# Patient Record
Sex: Female | Born: 2000 | Race: Black or African American | Hispanic: No | Marital: Single | Smoking: Never smoker
Health system: Southern US, Community
[De-identification: ages and names within clinical notes are randomized; demographics above are authoritative.]

## PROBLEM LIST (undated history)

## (undated) HISTORY — PX: HERNIA REPAIR: SHX51

---
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
## 2018-01-09 ENCOUNTER — Telehealth: Payer: Self-pay | Admitting: *Deleted

## 2018-01-09 NOTE — Telephone Encounter (Signed)
Lo LoEstrin denied by insurance.

## 2018-01-10 NOTE — Telephone Encounter (Signed)
Left message that Lo Lo not covered I have card to try

## 2018-01-23 ENCOUNTER — Ambulatory Visit: Payer: BLUE CROSS/BLUE SHIELD | Admitting: Adult Health

## 2020-02-05 ENCOUNTER — Ambulatory Visit: Payer: Self-pay | Attending: Internal Medicine

## 2020-02-05 DIAGNOSIS — Z23 Encounter for immunization: Secondary | ICD-10-CM

## 2020-02-05 NOTE — Progress Notes (Signed)
   Covid-19 Vaccination Clinic  Name:  Tricia Roth    MRN: [id number] DOB: 11-05-2000  02/05/2020  Tricia Roth was observed post Covid-19 immunization for 15 minutes without incident. She was provided with Vaccine Information Sheet and instruction to access the V-Safe system.   Tricia Roth was instructed to call 911 with any severe reactions post vaccine: Marland Kitchen Difficulty breathing  . Swelling of face and throat  . A fast heartbeat  . A bad rash all over body  . Dizziness and weakness   Immunizations Administered    Name Date Dose VIS Date Route   Moderna COVID-19 Vaccine 02/05/2020 12:18 PM 0.5 mL 05/2019 Intramuscular   Manufacturer: Moderna   Lot: [id number]   NDC: 02725-366-44

## 2020-03-04 ENCOUNTER — Ambulatory Visit: Payer: Self-pay

## 2020-07-07 ENCOUNTER — Ambulatory Visit: Payer: Self-pay | Admitting: Adult Health

## 2023-01-09 DIAGNOSIS — Z01419 Encounter for gynecological examination (general) (routine) without abnormal findings: Secondary | ICD-10-CM | POA: Diagnosis not present

## 2023-01-09 DIAGNOSIS — Z3202 Encounter for pregnancy test, result negative: Secondary | ICD-10-CM | POA: Diagnosis not present

## 2023-01-09 DIAGNOSIS — R87610 Atypical squamous cells of undetermined significance on cytologic smear of cervix (ASC-US): Secondary | ICD-10-CM | POA: Diagnosis not present

## 2023-01-09 DIAGNOSIS — Z3041 Encounter for surveillance of contraceptive pills: Secondary | ICD-10-CM | POA: Diagnosis not present

## 2023-01-09 DIAGNOSIS — N920 Excessive and frequent menstruation with regular cycle: Secondary | ICD-10-CM | POA: Diagnosis not present

## 2023-01-09 DIAGNOSIS — Z113 Encounter for screening for infections with a predominantly sexual mode of transmission: Secondary | ICD-10-CM | POA: Diagnosis not present

## 2023-01-09 DIAGNOSIS — Z114 Encounter for screening for human immunodeficiency virus [HIV]: Secondary | ICD-10-CM | POA: Diagnosis not present

## 2023-01-09 LAB — CYTOLOGY - PAP

## 2023-01-30 ENCOUNTER — Encounter: Payer: Self-pay | Admitting: Emergency Medicine

## 2023-01-30 ENCOUNTER — Ambulatory Visit
Admission: EM | Admit: 2023-01-30 | Discharge: 2023-01-30 | Disposition: A | Discharge status: Home / Self Care | Payer: Medicaid Other

## 2023-01-30 DIAGNOSIS — K219 Gastro-esophageal reflux disease without esophagitis: Secondary | ICD-10-CM

## 2023-01-30 DIAGNOSIS — K5909 Other constipation: Secondary | ICD-10-CM

## 2023-01-30 MED ORDER — PANTOPRAZOLE SODIUM 40 MG PO TBEC: 40.0000 mg | DELAYED_RELEASE_TABLET | Freq: Every day | ORAL | 0 refills | Status: AC

## 2023-01-30 MED ORDER — LIDOCAINE VISCOUS HCL 2 % MT SOLN
15.0000 mL | Freq: Once | OROMUCOSAL | Status: AC
  Administered 2023-01-30: 15 mL via OROMUCOSAL

## 2023-01-30 MED ORDER — SENNOSIDES-DOCUSATE SODIUM 8.6-50 MG PO TABS: 1.0000 | ORAL_TABLET | Freq: Every day | ORAL | 0 refills | Status: AC

## 2023-01-30 MED ORDER — ALUM & MAG HYDROXIDE-SIMETH 200-200-20 MG/5ML PO SUSP
30.0000 mL | Freq: Once | ORAL | Status: AC
  Administered 2023-01-30: 30 mL via ORAL

## 2023-01-30 NOTE — ED Provider Notes (Signed)
[facility]    CSN: [id number] Arrival date & time: 01/30/23  0934      History   Chief Complaint No chief complaint on file.   HPI Tricia Roth is a 22 y.o. female.   The history is provided by the patient.   Patient presents with a 4-day history of acid reflux.  Patient also complains of constipation, states her last bowel movement was approximately 3 days ago.  Patient reports that she feels like there is something "sitting on her chest".  She also states that she has had more "foam" in her throat.  She does have a history of reflux disease, she normally takes Prilosec.  Prilosec is not helping at this time.  She knows that her triggers are drinking alcohol and red meat.  She reports that she did drink heavily on a vacation approximately 4 days ago.  With regard to her abdominal pain, she does complain of bloating.  She denies fever, chills, abdominal pain, nausea, vomiting, diarrhea, or bloody stools.  Patient reports that she also has a history of constipation.  Past Medical History:  Diagnosis Date   Premature baby     Patient Active Problem List   Diagnosis Date Noted   Irregular intermenstrual bleeding 12/25/2017   Pregnancy examination or test, negative result 12/25/2017   Pelvic pain 12/25/2017   Constipation 12/25/2017   Nausea 12/25/2017   Breast tenderness 12/25/2017    Past Surgical History:  Procedure Laterality Date   HERNIA REPAIR      OB History     Gravida  0   Para  0   Term  0   Preterm  0   AB  0   Living  0      SAB  0   IAB  0   Ectopic  0   Multiple  0   Live Births  0            Home Medications    Prior to Admission medications   Medication Sig Start Date End Date Taking? Authorizing Provider  levonorgestrel-ethinyl estradiol (SEASONALE) 0.15-0.03 MG tablet Take 1 tablet by mouth daily.   Yes [provider]  omeprazole (PRILOSEC) 20 MG capsule Take 20 mg by mouth daily.   Yes [provider]    Family History Family History  Problem Relation Age of Onset   Hypertension Paternal Grandfather    Diabetes Paternal Grandfather    Hypertension Paternal Grandmother    Diabetes Paternal Grandmother    Breast cancer Maternal Grandmother    Hypertension Maternal Grandfather    Psoriasis Father     Social History Social History   Tobacco Use   Smoking status: Never   Smokeless tobacco: Never  Vaping Use   Vaping status: Never Used  Substance Use Topics   Alcohol use: No   Drug use: No     Allergies   Patient has no known allergies.   Review of Systems Review of Systems Per HPI  Physical Exam Triage Vital Signs ED Triage Vitals  Encounter Vitals Group     BP 01/30/23 0943 (!) 120/49     Systolic BP Percentile --      Diastolic BP Percentile --      Pulse Rate 01/30/23 0943 64     Resp 01/30/23 0943 16     Temp 01/30/23 0943 98.3 F (36.8 C)     Temp Source 01/30/23 0943 Oral     SpO2 01/30/23  0943 98 %     Weight --      Height --      Head Circumference --      Peak Flow --      Pain Score 01/30/23 0945 7     Pain Loc --      Pain Education --      Exclude from Growth Chart --    No data found.  Updated Vital Signs BP (!) 120/49 (BP Location: Right Arm)   Pulse 64   Temp 98.3 F (36.8 C) (Oral)   Resp 16   LMP 12/30/2022 (Exact Date)   SpO2 98%   Visual Acuity Right Eye Distance:   Left Eye Distance:   Bilateral Distance:    Right Eye Near:   Left Eye Near:    Bilateral Near:     Physical Exam Vitals and nursing note reviewed.  Constitutional:      General: She is not in acute distress.    Appearance: Normal appearance.  HENT:     Head: Normocephalic.  Eyes:     Extraocular Movements: Extraocular movements intact.     Conjunctiva/sclera: Conjunctivae normal.     Pupils: Pupils are equal, round, and reactive to light.  Cardiovascular:     Rate and Rhythm: Normal rate and regular rhythm.     Pulses: Normal  pulses.     Heart sounds: Normal heart sounds.  Pulmonary:     Effort: Pulmonary effort is normal.     Breath sounds: Normal breath sounds.  Abdominal:     General: Bowel sounds are normal.     Palpations: Abdomen is soft.     Tenderness: There is no abdominal tenderness.     Comments: GI cocktail administered.  Patient reports good relief of symptoms after GI cocktail.  Musculoskeletal:     Cervical back: Normal range of motion.  Lymphadenopathy:     Cervical: No cervical adenopathy.  Skin:    General: Skin is warm and dry.  Neurological:     General: No focal deficit present.     Mental Status: She is alert and oriented to person, place, and time.  Psychiatric:        Mood and Affect: Mood normal.        Behavior: Behavior normal.      UC Treatments / Results  Labs (all labs ordered are listed, but only abnormal results are displayed) Labs Reviewed - No data to display  EKG   Radiology No results found.  Procedures Procedures (including critical care time)  Medications Ordered in UC Medications  alum & mag hydroxide-simeth (MAALOX/MYLANTA) 200-200-20 MG/5ML suspension 30 mL (30 mLs Oral Given 01/30/23 1018)  lidocaine (XYLOCAINE) 2 % viscous mouth solution 15 mL (15 mLs Mouth/Throat Given 01/30/23 1018)    Initial Impression / Assessment and Plan / UC Course  I have reviewed the triage vital signs and the nursing notes.  Pertinent labs & imaging results that were available during my care of the patient were reviewed by me and considered in my medical decision making (see chart for details).  The patient is well-appearing, she is in no acute distress, vital signs are stable.  Patient with an exacerbation of her reflux disease.  GI cocktail was administered, with good relief of symptoms.  Will start patient on Protonix 40 mg for reflux.  For her constipation, patient was prescribed Senokot 8.6-50 mg tablets to take daily.  Supportive care recommendations were  provided for patient's reflux and  constipation to include avoiding triggers for her reflux, taking medication daily, eating a healthy diet, and increasing her fluid intake, and staying active.  Patient advised to follow-up with her primary care physician if symptoms do not improve with this treatment.  Patient is in agreement with this plan of care and verbalizes understanding.  All questions were answered.  Patient stable for discharge.Work note was provided.   Final Clinical Impressions(s) / UC Diagnoses   Final diagnoses:  None   Discharge Instructions   None    ED Prescriptions   None    PDMP not reviewed this encounter.   Abran Cantor, NP 01/30/23 1043

## 2023-01-30 NOTE — Discharge Instructions (Addendum)
Take medication as prescribed. Increase fluids and allow for plenty of rest. Avoid triggers for your reflux to include alcohol, red meat, or other things such as caffeine, dairy, spicy, or tomato-based foods. Make sure you are chewing your food well before swallowing.  Also recommend eating at least 2 to 3 hours before bedtime. For your constipation, is important for you to drink plenty of fluids, preferably 8-10 8 ounce glasses of water daily.  Is also help for you to increase the fruits and vegetables in your diet and to remain active to prevent constipation. If symptoms are not improving with this treatment, please follow-up with your primary care physician for further evaluation. Follow-up as needed.

## 2023-01-30 NOTE — ED Triage Notes (Signed)
States flare up with acid reflux after drinking heavily on vacation  x 4 days.  Has been taking Prilosec without relief.

## 2023-02-01 ENCOUNTER — Emergency Department (HOSPITAL_COMMUNITY)
Admission: EM | Admit: 2023-02-01 | Discharge: 2023-02-01 | Disposition: A | Discharge status: Home / Self Care | Payer: Medicaid Other | Attending: Emergency Medicine | Admitting: Emergency Medicine

## 2023-02-01 ENCOUNTER — Emergency Department (HOSPITAL_COMMUNITY): Payer: Medicaid Other

## 2023-02-01 ENCOUNTER — Encounter (HOSPITAL_COMMUNITY): Payer: Self-pay

## 2023-02-01 DIAGNOSIS — K219 Gastro-esophageal reflux disease without esophagitis: Secondary | ICD-10-CM | POA: Diagnosis not present

## 2023-02-01 DIAGNOSIS — R1030 Lower abdominal pain, unspecified: Secondary | ICD-10-CM | POA: Diagnosis present

## 2023-02-01 DIAGNOSIS — Z20822 Contact with and (suspected) exposure to covid-19: Secondary | ICD-10-CM | POA: Insufficient documentation

## 2023-02-01 DIAGNOSIS — R0981 Nasal congestion: Secondary | ICD-10-CM | POA: Insufficient documentation

## 2023-02-01 DIAGNOSIS — R12 Heartburn: Secondary | ICD-10-CM

## 2023-02-01 DIAGNOSIS — R059 Cough, unspecified: Secondary | ICD-10-CM | POA: Diagnosis not present

## 2023-02-01 DIAGNOSIS — R0789 Other chest pain: Secondary | ICD-10-CM | POA: Diagnosis not present

## 2023-02-01 LAB — SARS CORONAVIRUS 2 BY RT PCR: SARS Coronavirus 2 by RT PCR: NEGATIVE

## 2023-02-01 MED ORDER — SUCRALFATE 1 G PO TABS: 1.0000 g | ORAL_TABLET | Freq: Three times a day (TID) | ORAL | 0 refills | Status: AC | PRN

## 2023-02-01 MED ORDER — POLYETHYLENE GLYCOL 3350 17 GM/SCOOP PO POWD: 1.0000 | Freq: Once | ORAL | 0 refills | Status: AC

## 2023-02-01 MED ORDER — ONDANSETRON 8 MG PO TBDP
8.0000 mg | ORAL_TABLET | Freq: Once | ORAL | Status: AC
  Administered 2023-02-01: 8 mg via ORAL
  Filled 2023-02-01: qty 1

## 2023-02-01 MED ORDER — SUCRALFATE 1 G PO TABS
1.0000 g | ORAL_TABLET | Freq: Once | ORAL | Status: AC
  Administered 2023-02-01: 1 g via ORAL
  Filled 2023-02-01: qty 1

## 2023-02-01 MED ORDER — ONDANSETRON 4 MG PO TBDP: 4.0000 mg | ORAL_TABLET | Freq: Three times a day (TID) | ORAL | 0 refills | Status: AC | PRN

## 2023-02-01 MED ORDER — FAMOTIDINE 20 MG PO TABS
20.0000 mg | ORAL_TABLET | Freq: Once | ORAL | Status: AC
  Administered 2023-02-01: 20 mg via ORAL
  Filled 2023-02-01: qty 1

## 2023-02-01 NOTE — ED Notes (Signed)
Pt states, "I drank a lot of liquor in [place] last week and I think that's what started all of this."

## 2023-02-01 NOTE — ED Provider Notes (Signed)
[place] EMERGENCY DEPARTMENT AT [facility] Provider Note   CSN: [id number] Arrival date & time: 02/01/23  4259     History  Chief Complaint  Patient presents with   Nausea    Tricia Roth is a 22 y.o. female.  HPI   22 year old female presents emergency department with complaints of "worsening acid reflux."  Patient states that she went to [place] last week and drink more alcohol than usual on Friday and since then has had worsening burning sensation in her upper abdomen as well as in her esophagus.  Patient reports history of GERD and was on medicine in the past for treatment but has since not been taking it.  Was seen at the urgent care yesterday and given GI cocktail with significant improvement of symptoms.  States she was sent home with Protonix but due to recurrence of symptoms today, prompted visit to the emergency department.  Reports worsening of burning type sensation after she eats or drinks liquid and relief when she is not consuming anything.  Also states she has had cough as well as some nasal congestion and mildly sore throat since traveling on Friday and is concerned about possible COVID exposure.  She states that along with cough, has had some lower abdominal pain that is only experienced when she is coughing and not experienced otherwise.  Denies any fever, chills, shortness of breath, vomiting, urinary symptoms, change in bowel habits.  End of her last menstrual period July 20 and patient states she is not concerned about pregnancy.  Past medical history significant for GERD  Home Medications Prior to Admission medications   Medication Sig Start Date End Date Taking? Authorizing Provider  ondansetron (ZOFRAN-ODT) 4 MG disintegrating tablet Take 1 tablet (4 mg total) by mouth every 8 (eight) hours as needed. 02/01/23  Yes Sherian Maroon A, PA  polyethylene glycol powder (GLYCOLAX/MIRALAX) 17 GM/SCOOP powder Take 255 g by mouth once for 1 dose. 02/01/23  02/01/23 Yes Sherian Maroon A, PA  sucralfate (CARAFATE) 1 g tablet Take 1 tablet (1 g total) by mouth 3 (three) times daily as needed (with meals). 02/01/23  Yes Peter Garter, PA  levonorgestrel-ethinyl estradiol (SEASONALE) 0.15-0.03 MG tablet Take 1 tablet by mouth daily.    [provider]  omeprazole (PRILOSEC) 20 MG capsule Take 20 mg by mouth daily.    [provider]  pantoprazole (PROTONIX) 40 MG tablet Take 1 tablet (40 mg total) by mouth daily. 01/30/23   Leath-Warren, Sadie Haber, NP  senna-docusate (SENOKOT-S) 8.6-50 MG tablet Take 1 tablet by mouth at bedtime. 01/30/23   Leath-Warren, Sadie Haber, NP      Allergies    Patient has no known allergies.    Review of Systems   Review of Systems  All other systems reviewed and are negative.   Physical Exam Updated Vital Signs BP 138/74 (BP Location: Right Arm)   Pulse 60   Temp 98.3 F (36.8 C) (Oral)   Resp 17   LMP 12/30/2022 (Exact Date)   SpO2 100%  Physical Exam Vitals and nursing note reviewed.  Constitutional:      General: She is not in acute distress.    Appearance: She is well-developed.  HENT:     Head: Normocephalic and atraumatic.     Nose: Congestion and rhinorrhea present.     Mouth/Throat:     Comments: Mild posterior pharyngeal erythema.  Uvula midline and symmetric with phonation.  No sublingual or semitubular swelling.  Tonsils  are 1+ bilaterally with no obvious exudate.  No trismus.  No changes in phonation per patient. Eyes:     Conjunctiva/sclera: Conjunctivae normal.  Cardiovascular:     Rate and Rhythm: Normal rate and regular rhythm.     Heart sounds: No murmur heard. Pulmonary:     Effort: Pulmonary effort is normal. No respiratory distress.     Breath sounds: Normal breath sounds. No wheezing, rhonchi or rales.  Chest:     Chest wall: No tenderness.  Abdominal:     Palpations: Abdomen is soft.     Tenderness: There is no abdominal tenderness. There is no right CVA  tenderness, left CVA tenderness or guarding.  Musculoskeletal:        General: No swelling.     Cervical back: Neck supple.     Right lower leg: No edema.     Left lower leg: No edema.  Skin:    General: Skin is warm and dry.     Capillary Refill: Capillary refill takes less than 2 seconds.  Neurological:     Mental Status: She is alert.  Psychiatric:        Mood and Affect: Mood normal.     ED Results / Procedures / Treatments   Labs (all labs ordered are listed, but only abnormal results are displayed) Labs Reviewed  SARS CORONAVIRUS 2 BY RT PCR    EKG EKG Interpretation Date/Time:  Thursday February 01 2023 09:52:36 EDT Ventricular Rate:  55 PR Interval:  138 QRS Duration:  65 QT Interval:  411 QTC Calculation: 394 R Axis:   38  Text Interpretation: Sinus rhythm no acute ST/T changes No old tracing to compare Confirmed by Pricilla Loveless [phone]) on 02/01/2023 9:58:52 AM  Radiology DG Chest 2 View  Result Date: 02/01/2023 CLINICAL DATA:  Cough.  History of acid reflux for 6 days EXAM: CHEST - 2 VIEW COMPARISON:  X-ray 08/16/2016 FINDINGS: The heart size and mediastinal contours are within normal limits. Both lungs are clear. No consolidation, pneumothorax or effusion. No edema. The visualized skeletal structures are unremarkable. IMPRESSION: No acute cardiopulmonary disease. Electronically Signed   By: Karen Kays M.D.   On: 02/01/2023 10:05    Procedures Procedures    Medications Ordered in ED Medications  sucralfate (CARAFATE) tablet 1 g (1 g Oral Given 02/01/23 0954)  famotidine (PEPCID) tablet 20 mg (20 mg Oral Given 02/01/23 0954)  ondansetron (ZOFRAN-ODT) disintegrating tablet 8 mg (8 mg Oral Given 02/01/23 6045)    ED Course/ Medical Decision Making/ A&P                                 Medical Decision Making  This patient presents to the ED for concern of burning type sensation in her chest as well as upper abdomen, this involves an extensive number of  treatment options, and is a complaint that carries with it a high risk of complications and morbidity.  The differential diagnosis includes ACS, PE, GERD, gastritis, esophagitis, COVID, RSV, influenza, pneumonia   Co morbidities that complicate the patient evaluation  See HPI   Additional history obtained:  Additional history obtained from EMR External records from outside source obtained and reviewed including hospital records   Lab Tests:  I Ordered, and personally interpreted labs.  The pertinent results include: COVID which is pending   Imaging Studies ordered:  I ordered imaging studies including chest x-ray I independently visualized and  interpreted imaging which showed no acute cardiopulmonary abnormalities I agree with the radiologist interpretation   Cardiac Monitoring: / EKG:  The patient was maintained on a cardiac monitor.  I personally viewed and interpreted the cardiac monitored which showed an underlying rhythm of: Sinus rhythm   Consultations Obtained:  N/a   Problem List / ED Course / Critical interventions / Medication management  GERD I ordered medication including Zofran, Pepcid, Carafate  Reevaluation of the patient after these medicines showed that the patient improved I have reviewed the patients home medicines and have made adjustments as needed   Social Determinants of Health:  Denies tobacco, illicit drug use   Test / Admission - Considered:  GERD Vitals signs within normal range and stable throughout visit. Laboratory/imaging studies significant for: See above 22 year old female presents emergency department with complaints of intermittent burning type sensation in her upper abdomen with radiation "up my esophagus."  Patient symptoms seem to be worsened with consumption of food with no other exacerbating factors.  Chest discomfort seems more likely associated to reflux.  Patient with chest x-ray without signs of any acute  cardiopulmonary abnormality.  Low suspicion for pneumonia, esophageal perforation, pneumomediastinum or other abnormality.  Patient's EKG without evidence of ischemic changes; low suspicion for ACS.  Patient did note improvement of symptoms with administration of GI cocktail in the emergency department.  Will recommend continued use of Protonix in the outpatient setting as well as carafate to use as needed.  Patient has only had 1 day of Protonix so do not think she has technically failed said therapy.  Will recommend additional lifestyle modifications as depicted in AVS.  Will recommend close follow-up with primary care for reevaluation of symptoms.  Treatment plan discussed at length with patient and she acknowledged understanding was agreeable to said plan.  Patient overall well-appearing, afebrile in no acute distress. Worrisome signs and symptoms were discussed with the patient, and the patient acknowledged understanding to return to the ED if noticed. Patient was stable upon discharge.          Final Clinical Impression(s) / ED Diagnoses Final diagnoses:  Waterbrash  Gastroesophageal reflux disease, unspecified whether esophagitis present    Rx / DC Orders ED Discharge Orders          Ordered    sucralfate (CARAFATE) 1 g tablet  3 times daily PRN        02/01/23 1034    ondansetron (ZOFRAN-ODT) 4 MG disintegrating tablet  Every 8 hours PRN        02/01/23 1034    polyethylene glycol powder (GLYCOLAX/MIRALAX) 17 GM/SCOOP powder   Once        02/01/23 1034              Peter Garter, [place] 02/01/23 1046    Pricilla Loveless, MD 02/02/23 1523

## 2023-02-01 NOTE — Discharge Instructions (Addendum)
As discussed, chest x-ray was without abnormality.  EKG normal.  I suspect your symptoms are likely secondary to GERD or reflux.  Recommend continue use of your Protonix in the outpatient setting as well as adding Carafate to use as needed.  See information attached your discharge papers regarding lifestyle modifications you may try to decrease likelihood of "GERD flareups."  Regarding feelings of constipation, you may add a daily fiber supplement that you can find over-the-counter local pharmacy as well as use  miralax 2-4 capfuls intially to help regularize your bowel movements. You may increase or decrease miralax for soft, regular bowel movments.  Follow MyChart for the results of your COVID testing.  Recommend follow-up with primary care for reassessment of your symptoms.  Please do not hesitate to return to emergency department if the worrisome signs and symptoms we discussed become apparent.

## 2023-02-01 NOTE — ED Triage Notes (Signed)
Pt arrives via POV with c/o nausea and reflux since returning from [place] this past Sunday. Pt states 2 days ago she began having sharp pains that come and go on her left lower abdomen, but denies abd pain at this time. LMP 7/13.

## 2023-03-12 ENCOUNTER — Ambulatory Visit: Payer: Medicaid Other | Admitting: Family Medicine

## 2023-03-15 ENCOUNTER — Encounter: Payer: Self-pay | Admitting: Family Medicine

## 2023-04-10 ENCOUNTER — Emergency Department (HOSPITAL_COMMUNITY): Payer: Medicaid Other

## 2023-04-10 ENCOUNTER — Encounter (HOSPITAL_COMMUNITY): Payer: Self-pay | Admitting: *Deleted

## 2023-04-10 ENCOUNTER — Emergency Department (HOSPITAL_COMMUNITY)
Admission: EM | Admit: 2023-04-10 | Discharge: 2023-04-10 | Disposition: A | Discharge status: Home / Self Care | Payer: Medicaid Other | Attending: Emergency Medicine | Admitting: Emergency Medicine

## 2023-04-10 ENCOUNTER — Other Ambulatory Visit: Payer: Self-pay

## 2023-04-10 DIAGNOSIS — B349 Viral infection, unspecified: Secondary | ICD-10-CM | POA: Insufficient documentation

## 2023-04-10 DIAGNOSIS — R519 Headache, unspecified: Secondary | ICD-10-CM | POA: Diagnosis not present

## 2023-04-10 DIAGNOSIS — Z20822 Contact with and (suspected) exposure to covid-19: Secondary | ICD-10-CM | POA: Insufficient documentation

## 2023-04-10 DIAGNOSIS — M791 Myalgia, unspecified site: Secondary | ICD-10-CM | POA: Diagnosis present

## 2023-04-10 LAB — SARS CORONAVIRUS 2 BY RT PCR: SARS Coronavirus 2 by RT PCR: NEGATIVE

## 2023-04-10 MED ORDER — IBUPROFEN 800 MG PO TABS
800.0000 mg | ORAL_TABLET | Freq: Once | ORAL | Status: AC
  Administered 2023-04-10: 800 mg via ORAL
  Filled 2023-04-10: qty 1

## 2023-04-10 NOTE — ED Provider Notes (Signed)
[place] EMERGENCY DEPARTMENT AT [facility] Provider Note   CSN: [id number] Arrival date & time: 04/10/23  9562     History  Chief Complaint  Patient presents with   Generalized Body Aches    Tricia Roth is a 22 y.o. female with no significant past medical history presents the ED today for body aches.  Patient reports a 4 day history of generalized bodyaches, headache, and productive cough.  She has been taking over-the-counter severe cold and flu medication with some relief of symptoms.  Denies fevers, shortness of breath, nausea, vomiting, or diarrhea.  No focal neuro deficits. Patient's sister has the same symptoms. No additional complaints or concerns at this time.    Home Medications Prior to Admission medications   Medication Sig Start Date End Date Taking? Authorizing Provider  levonorgestrel-ethinyl estradiol (SEASONALE) 0.15-0.03 MG tablet Take 1 tablet by mouth daily.    [provider]  omeprazole (PRILOSEC) 20 MG capsule Take 20 mg by mouth daily.    [provider]  ondansetron (ZOFRAN-ODT) 4 MG disintegrating tablet Take 1 tablet (4 mg total) by mouth every 8 (eight) hours as needed. 02/01/23   Peter Garter, PA  pantoprazole (PROTONIX) 40 MG tablet Take 1 tablet (40 mg total) by mouth daily. 01/30/23   Leath-Warren, Sadie Haber, NP  senna-docusate (SENOKOT-S) 8.6-50 MG tablet Take 1 tablet by mouth at bedtime. 01/30/23   Leath-Warren, Sadie Haber, NP  sucralfate (CARAFATE) 1 g tablet Take 1 tablet (1 g total) by mouth 3 (three) times daily as needed (with meals). 02/01/23   Peter Garter, PA      Allergies    Patient has no known allergies.    Review of Systems   Review of Systems  Constitutional:  Positive for fatigue.  All other systems reviewed and are negative.   Physical Exam Updated Vital Signs BP 128/62   Pulse (!) 41   Temp 98.9 F (37.2 C) (Oral)   Resp 20   Ht 5' 3.5" (1.613 m)   Wt 76.2 kg   LMP 04/04/2023    SpO2 100%   BMI 29.29 kg/m  Physical Exam Vitals and nursing note reviewed.  Constitutional:      General: She is not in acute distress.    Appearance: Normal appearance.  HENT:     Head: Normocephalic and atraumatic.     Mouth/Throat:     Mouth: Mucous membranes are moist.     Pharynx: Oropharynx is clear. No oropharyngeal exudate or posterior oropharyngeal erythema.  Eyes:     Conjunctiva/sclera: Conjunctivae normal.     Pupils: Pupils are equal, round, and reactive to light.  Cardiovascular:     Rate and Rhythm: Normal rate and regular rhythm.     Pulses: Normal pulses.     Heart sounds: Normal heart sounds.  Pulmonary:     Effort: Pulmonary effort is normal.     Breath sounds: Normal breath sounds.  Abdominal:     Palpations: Abdomen is soft.     Tenderness: There is no abdominal tenderness.  Musculoskeletal:     Cervical back: Normal range of motion and neck supple. No tenderness.  Lymphadenopathy:     Cervical: No cervical adenopathy.  Skin:    General: Skin is warm and dry.     Findings: No rash.  Neurological:     General: No focal deficit present.     Mental Status: She is alert.  Psychiatric:  Mood and Affect: Mood normal.        Behavior: Behavior normal.     ED Results / Procedures / Treatments   Labs (all labs ordered are listed, but only abnormal results are displayed) Labs Reviewed  SARS CORONAVIRUS 2 BY RT PCR    EKG None  Radiology No results found.  Procedures Procedures: not indicated.   Medications Ordered in ED Medications - No data to display  ED Course/ Medical Decision Making/ A&P                                 Medical Decision Making Amount and/or Complexity of Data Reviewed Radiology: ordered.   This patient presents to the ED for concern of bodyaches and productive cough, this involves an extensive number of treatment options, and is a complaint that carries with it a high risk of complications and morbidity.    Differential diagnosis includes: COVID, flu, RSV, URI, pneumonia, etc.   Comorbidities  No significant past medical history   Additional History  Additional history obtained from previous ED notes.   Lab Tests  I ordered and personally interpreted labs.  The pertinent results include:   Negative for COVID   Imaging Studies  I ordered imaging studies including CXR  I independently visualized and interpreted imaging which showed: no acute cardiopulmonary findings. I agree with the radiologist interpretation   Problem List / ED Course / Critical Interventions / Medication Management  Body aches and productive cough x4 days I ordered medications including: Ibuprofen for body aches  Reevaluation of the patient after these medicines showed that the patient improved I have reviewed the patients home medicines and have made adjustments as needed   Social Determinants of Health  Social connection   Test / Admission - Considered  Discussed findings with patient.  All questions were answered. She is hemodynamically stable and safe for discharge home. Return precautions provided.       Final Clinical Impression(s) / ED Diagnoses Final diagnoses:  None    Rx / DC Orders ED Discharge Orders     None         Maxwell Marion, PA-C 04/10/23 1153    Gloris Manchester, MD 04/10/23 1600

## 2023-04-10 NOTE — ED Notes (Signed)
Covid swab sent to lab.

## 2023-04-10 NOTE — ED Triage Notes (Signed)
Pt c/o generalized body aches with headache and cough x 4 days

## 2023-04-10 NOTE — Discharge Instructions (Addendum)
You most likely have a virus causing your symptoms.  You can take Ibuprofen and Tylenol as needed for symptoms.  Follow up with your PCP in 1 week for reevaluation.  Get help right away if: You have trouble breathing. You have a severe headache or a stiff neck. You have severe vomiting or pain in your abdomen.

## 2023-12-02 ENCOUNTER — Other Ambulatory Visit: Payer: Self-pay

## 2023-12-02 ENCOUNTER — Emergency Department (HOSPITAL_COMMUNITY)

## 2023-12-02 ENCOUNTER — Encounter (HOSPITAL_COMMUNITY): Payer: Self-pay

## 2023-12-02 ENCOUNTER — Emergency Department (HOSPITAL_COMMUNITY)
Admission: EM | Admit: 2023-12-02 | Discharge: 2023-12-02 | Disposition: A | Discharge status: Home / Self Care | Attending: Emergency Medicine | Admitting: Emergency Medicine

## 2023-12-02 DIAGNOSIS — R102 Pelvic and perineal pain: Secondary | ICD-10-CM

## 2023-12-02 DIAGNOSIS — R1032 Left lower quadrant pain: Secondary | ICD-10-CM | POA: Diagnosis present

## 2023-12-02 DIAGNOSIS — R109 Unspecified abdominal pain: Secondary | ICD-10-CM | POA: Diagnosis not present

## 2023-12-02 DIAGNOSIS — N946 Dysmenorrhea, unspecified: Secondary | ICD-10-CM | POA: Diagnosis not present

## 2023-12-02 LAB — COMPREHENSIVE METABOLIC PANEL WITH GFR
ALT: 13 U/L (ref 0–44)
AST: 19 U/L (ref 15–41)
Albumin: 3.9 g/dL (ref 3.5–5.0)
Alkaline Phosphatase: 97 U/L (ref 38–126)
Anion gap: 7 (ref 5–15)
BUN: 12 mg/dL (ref 6–20)
CO2: 24 mmol/L (ref 22–32)
Calcium: 8.9 mg/dL (ref 8.9–10.3)
Chloride: 108 mmol/L (ref 98–111)
Creatinine, Ser: 1.07 mg/dL — ABNORMAL HIGH (ref 0.44–1.00)
GFR, Estimated: >60 mL/min (ref >60)
Glucose, Bld: 102 mg/dL — ABNORMAL HIGH (ref 70–99)
Potassium: 3.6 mmol/L (ref 3.5–5.1)
Sodium: 139 mmol/L (ref 135–145)
Total Bilirubin: 0.3 mg/dL (ref 0.0–1.2)
Total Protein: 7.4 g/dL (ref 6.5–8.1)

## 2023-12-02 LAB — CBC WITH DIFFERENTIAL/PLATELET
Abs Immature Granulocytes: 0.02 10*3/uL (ref 0.00–0.07)
Basophils Absolute: 0 10*3/uL (ref 0.0–0.1)
Basophils Relative: 0 %
Eosinophils Absolute: 0.1 10*3/uL (ref 0.0–0.5)
Eosinophils Relative: 1 %
HCT: 39.3 % (ref 36.0–46.0)
Hemoglobin: 12.9 g/dL (ref 12.0–15.0)
Immature Granulocytes: 0 %
Lymphocytes Relative: 20 %
Lymphs Abs: 1.6 10*3/uL (ref 0.7–4.0)
MCH: 26.6 pg (ref 26.0–34.0)
MCHC: 32.8 g/dL (ref 30.0–36.0)
MCV: 81 fL (ref 80.0–100.0)
Monocytes Absolute: 0.5 10*3/uL (ref 0.1–1.0)
Monocytes Relative: 6 %
Neutro Abs: 5.9 10*3/uL (ref 1.7–7.7)
Neutrophils Relative %: 73 %
Platelets: 194 10*3/uL (ref 150–400)
RBC: 4.85 MIL/uL (ref 3.87–5.11)
RDW: 14.3 % (ref 11.5–15.5)
WBC: 8.1 10*3/uL (ref 4.0–10.5)
nRBC: 0 % (ref 0.0–0.2)

## 2023-12-02 LAB — URINALYSIS, ROUTINE W REFLEX MICROSCOPIC
Bacteria, UA: NONE SEEN
Bilirubin Urine: NEGATIVE
Glucose, UA: NEGATIVE mg/dL
Ketones, ur: NEGATIVE mg/dL
Leukocytes,Ua: NEGATIVE
Nitrite: NEGATIVE
Protein, ur: NEGATIVE mg/dL
Specific Gravity, Urine: 1.015 (ref 1.005–1.030)
pH: 5 (ref 5.0–8.0)

## 2023-12-02 LAB — PREGNANCY, URINE: Preg Test, Ur: NEGATIVE

## 2023-12-02 MED ORDER — ONDANSETRON HCL 4 MG/2ML IJ SOLN
4.0000 mg | Freq: Once | INTRAMUSCULAR | Status: AC
  Administered 2023-12-02: 4 mg via INTRAVENOUS
  Filled 2023-12-02: qty 2

## 2023-12-02 MED ORDER — KETOROLAC TROMETHAMINE 30 MG/ML IJ SOLN
30.0000 mg | Freq: Once | INTRAMUSCULAR | Status: AC
  Administered 2023-12-02: 30 mg via INTRAVENOUS
  Filled 2023-12-02: qty 1

## 2023-12-02 MED ORDER — HYDROMORPHONE HCL 1 MG/ML IJ SOLN
1.0000 mg | Freq: Once | INTRAMUSCULAR | Status: AC
  Administered 2023-12-02: 1 mg via INTRAVENOUS
  Filled 2023-12-02: qty 1

## 2023-12-02 MED ORDER — ONDANSETRON HCL 4 MG PO TABS: 4.0000 mg | ORAL_TABLET | Freq: Four times a day (QID) | ORAL | 0 refills | Status: AC

## 2023-12-02 MED ORDER — MELOXICAM 15 MG PO TBDP: 15.0000 mg | ORAL_TABLET | Freq: Every day | ORAL | 0 refills | Status: AC

## 2023-12-02 NOTE — ED Notes (Signed)
 See triage notes. Pt tearful and guarding left side. Pain to left flank/abd/back started early this am.

## 2023-12-02 NOTE — ED Notes (Signed)
 Pt in CT. Pt still tearful

## 2023-12-02 NOTE — ED Triage Notes (Signed)
 Pt c/o abd pain that radiates to her left flank pain.

## 2023-12-02 NOTE — Discharge Instructions (Signed)
 Thankfully all of your tests have been reassuring, the CT scan did not show anything specifically abnormal, there is a small amount of fluid in your pelvis, I do want you to follow-up with a gynecologist for further evaluation as you may need a workup for endometriosis.  This is something that cannot be diagnosed in the emergency department but what I can tell you is that there was no signs of surgical causes such as appendicitis, gallbladder disease, kidney stones or any other severe problems.  Over the next couple of weeks feel free to take the following medications to help with pain and nausea  Please take Mobic,  once daily as needed for pain - this in an antiinflammatory medicine (NSAID) and is similar to ibuprofen  - many people feel that it is stronger than ibuprofen  and it is easier to take since it is a smaller pill.  Please use this only for 1 week - if your pain persists, you will need to follow up with your doctor in the office for ongoing guidance and pain control.  Zofran  is a medication which can help with nausea.  You may take 4 mg by mouth every 6 hours as needed if you are an adult, if your child under the age of 6 take half of a tablet or 2 mg every 6 hours as needed.  This should dissolve on your tongue within a short timeframe.  Wait about 30 minutes after taking it to help with drinking clear liquids.  [facility] of [facility] that can follow-up with you for your pelvic pain

## 2023-12-02 NOTE — ED Notes (Signed)
 Pt returned from ct

## 2023-12-02 NOTE — ED Provider Notes (Signed)
 [place] EMERGENCY DEPARTMENT AT [facility] Provider Note   CSN: [id number] Arrival date & time: 12/02/23  4098     Patient presents with: Abdominal Pain   Tricia Roth is a 23 y.o. female.    Abdominal Pain  This patient is a 23 year old female endorsing a history of acid reflux on pantoprazole , she presents with what she describes as menstrual pain, started 2 days ago with the onset of her cycle, normal flow, fairly severe pain in the left lower quadrant radiating into the left flank, she has never had anything like this before as far as intensity and has vomited 3 or 4 times this morning with the pain.  She reports it is not unusual for her to have some pain during the cycle and the occasional episode of vomiting but not to this degree.  She denies being pregnant, denies vaginal discharge, denies fevers or chills, denies coughing or shortness of breath, has not had any medications prior to arrival.  He denies prior abdominal surgery, kidney stones or any other urinary tract infection symptoms such as dysuria or frequency    Prior to Admission medications   Medication Sig Start Date End Date Taking? Authorizing Provider  Meloxicam 15 MG TBDP Take 15 mg by mouth daily for 14 days. 12/02/23 12/16/23 Yes Early Glisson, MD  ondansetron  (ZOFRAN ) 4 MG tablet Take 1 tablet (4 mg total) by mouth every 6 (six) hours. 12/02/23  Yes Early Glisson, MD  levonorgestrel-ethinyl estradiol (SEASONALE) 0.15-0.03 MG tablet Take 1 tablet by mouth daily.    [provider]  omeprazole (PRILOSEC) 20 MG capsule Take 20 mg by mouth daily.    [provider]  ondansetron  (ZOFRAN -ODT) 4 MG disintegrating tablet Take 1 tablet (4 mg total) by mouth every 8 (eight) hours as needed. 02/01/23   [place] Butter, PA  pantoprazole  (PROTONIX ) 40 MG tablet Take 1 tablet (40 mg total) by mouth daily. 01/30/23   Leath-Warren, Belen Bowers, NP  senna-docusate (SENOKOT-S) 8.6-50 MG tablet Take 1  tablet by mouth at bedtime. 01/30/23   Leath-Warren, Belen Bowers, NP  sucralfate  (CARAFATE ) 1 g tablet Take 1 tablet (1 g total) by mouth 3 (three) times daily as needed (with meals). 02/01/23   [place] Butter, PA    Allergies: Patient has no known allergies.    Review of Systems  Gastrointestinal:  Positive for abdominal pain.  All other systems reviewed and are negative.   Updated Vital Signs BP 135/65   Pulse 65   Temp 98.5 F (36.9 C)   Resp 19   Ht 1.6 m (5' 3)   Wt 79.4 kg   LMP 12/02/2023 (Exact Date)   SpO2 99%   BMI 31.00 kg/m   Physical Exam Vitals and nursing note reviewed.  Constitutional:      General: She is not in acute distress.    Appearance: She is well-developed.  HENT:     Head: Normocephalic and atraumatic.     Mouth/Throat:     Pharynx: No oropharyngeal exudate.   Eyes:     General: No scleral icterus.       Right eye: No discharge.        Left eye: No discharge.     Conjunctiva/sclera: Conjunctivae normal.     Pupils: Pupils are equal, round, and reactive to light.   Neck:     Thyroid: No thyromegaly.     Vascular: No JVD.   Cardiovascular:     Rate and  Rhythm: Normal rate and regular rhythm.     Heart sounds: Normal heart sounds. No murmur heard.    No friction rub. No gallop.  Pulmonary:     Effort: Pulmonary effort is normal. No respiratory distress.     Breath sounds: Normal breath sounds. No wheezing or rales.  Abdominal:     General: Bowel sounds are normal. There is no distension.     Palpations: Abdomen is soft. There is no mass.     Tenderness: There is abdominal tenderness.     Comments: Mild tenderness left lower quadrant and left flank, no rashes seen in that area, no pulsating masses, no right upper or right lower quadrant tenderness at all   Musculoskeletal:        General: No tenderness. Normal range of motion.     Cervical back: Normal range of motion and neck supple.  Lymphadenopathy:     Cervical: No cervical  adenopathy.   Skin:    General: Skin is warm and dry.     Findings: No erythema or rash.   Neurological:     Mental Status: She is alert.     Coordination: Coordination normal.   Psychiatric:        Behavior: Behavior normal.     (all labs ordered are listed, but only abnormal results are displayed) Labs Reviewed  COMPREHENSIVE METABOLIC PANEL WITH GFR - Abnormal; Notable for the following components:      Result Value   Glucose, Bld 102 (*)    Creatinine, Ser 1.07 (*)    All other components within normal limits  URINALYSIS, ROUTINE W REFLEX MICROSCOPIC - Abnormal; Notable for the following components:   Hgb urine dipstick MODERATE (*)    All other components within normal limits  CBC WITH DIFFERENTIAL/PLATELET  PREGNANCY, URINE    EKG: None  Radiology: CT Renal Stone Study Result Date: 12/02/2023 CLINICAL DATA:  Left flank pain EXAM: CT ABDOMEN AND PELVIS WITHOUT CONTRAST TECHNIQUE: Multidetector CT imaging of the abdomen and pelvis was performed following the standard protocol without IV contrast. RADIATION DOSE REDUCTION: This exam was performed according to the departmental dose-optimization program which includes automated exposure control, adjustment of the mA and/or kV according to patient size and/or use of iterative reconstruction technique. COMPARISON:  None Available. FINDINGS: Lower chest: No acute abnormality. Hepatobiliary: No focal liver abnormality is seen. No gallstones, gallbladder wall thickening, or biliary dilatation. Pancreas: Unremarkable. No pancreatic ductal dilatation or surrounding inflammatory changes. Spleen: Normal in size without focal abnormality. Adrenals/Urinary Tract: Adrenal glands are unremarkable. Kidneys are normal, without renal calculi, focal lesion, or hydronephrosis. Bladder is unremarkable. Stomach/Bowel: Stomach is within normal limits. Appendix appears normal. No evidence of bowel wall thickening, distention, or inflammatory changes.  Vascular/Lymphatic: No significant vascular findings are present. No enlarged abdominal or pelvic lymph nodes. Limited evaluation in the absence of intravenous contrast. Reproductive: Uterus and bilateral adnexa are unremarkable. Tampon in place. Other: No abdominal wall hernia or abnormality. No abdominopelvic ascites. Musculoskeletal: No acute or significant osseous findings. IMPRESSION: Normal CT scan of the abdomen and pelvis. No evidence of nephrolithiasis. Electronically Signed   By: Fernando Hoyer M.D.   On: 12/02/2023 08:31     Procedures   Medications Ordered in the ED  ketorolac (TORADOL) 30 MG/ML injection 30 mg (30 mg Intravenous Given 12/02/23 0734)  ondansetron  (ZOFRAN ) injection 4 mg (4 mg Intravenous Given 12/02/23 0734)  HYDROmorphone (DILAUDID) injection 1 mg (1 mg Intravenous Given 12/02/23 0809)  Medical Decision Making Amount and/or Complexity of Data Reviewed Labs: ordered. Radiology: ordered.  Risk Prescription drug management.   This patient has a reassuring abdominal exam but with increasing symptoms compared to normal cycles.  This would raise concern for other things such as possible pregnancy or ectopic, kidney stone, endometriosis, labs will be ordered and a CT renal study if the patient is not pregnant, labs will be ordered and a CT renal study if the patient is not pregnant  Additional history obtained from: Family members at the bedside to corroborate the patient story and discussed the patient's prior history of gastritis and acid reflux disease.  Medical record reviewed showing no prior admissions to the hospital, she does follow in the outpatient setting with gynecology and had been seen for irregular menstrual cycles several years ago   Radiology Imaging: I personally viewed the images of the ordered radiographic studies and find no findings of surgical causes I agree with the radiologist interpretation as  well  Labs:  I  personally viewed and interpreted the labs which show no significant findings on labs including CBC or metabolic panel, not pregnant, no UTI  Meds / Interventions: while in the ED the patient received the following: Ketorolac and Zofran  The response to the interventions was that the patient improvement   I have discussed with the patient at the bedside the results, and the meaning of these results.  They have had opportunity to ask questions,  expressed their understanding to the need for follow-up with primary care physician      Final diagnoses:  Pelvic pain  Menstrual cramps    ED Discharge Orders          Ordered    Meloxicam 15 MG TBDP  Daily        12/02/23 0910    ondansetron  (ZOFRAN ) 4 MG tablet  Every 6 hours        12/02/23 0910               Early Glisson, MD 12/02/23 [phone]

## 2023-12-03 ENCOUNTER — Ambulatory Visit (INDEPENDENT_AMBULATORY_CARE_PROVIDER_SITE_OTHER): Admitting: Women's Health

## 2023-12-03 ENCOUNTER — Other Ambulatory Visit (HOSPITAL_COMMUNITY)
Admission: RE | Admit: 2023-12-03 | Discharge: 2023-12-03 | Disposition: A | Discharge status: Home / Self Care | Source: Ambulatory Visit | Attending: Women's Health | Admitting: Women's Health

## 2023-12-03 ENCOUNTER — Encounter: Payer: Self-pay | Admitting: Women's Health

## 2023-12-03 VITALS — BP 117/78 | HR 73 | Ht 63.0 in | Wt 169.0 lb

## 2023-12-03 DIAGNOSIS — N946 Dysmenorrhea, unspecified: Secondary | ICD-10-CM

## 2023-12-03 DIAGNOSIS — R109 Unspecified abdominal pain: Secondary | ICD-10-CM

## 2023-12-03 DIAGNOSIS — R1032 Left lower quadrant pain: Secondary | ICD-10-CM

## 2023-12-03 NOTE — Progress Notes (Signed)
 GYN VISIT Patient name: Tricia Roth MRN [id number]  Date of birth: 07-23-2000 Chief Complaint:   Follow-up [facility] pelvic pain.want another form birth control pill, spotting on one taking)  History of Present Illness:   Tricia Roth is a 23 y.o. G0P0000 African-American female being seen today for ED f/u. Went to ED yesterday w/ Lt flank pain that started w/ period yesterday. Neg renal stone study CT, uterus/adnexa were unremarkable. Was given toradol, zofran  and dilaudid and pain improved. Still there slightly but nothing like it was. Denies abnormal discharge, itching/odor/irritation. No problems peeing/pooping.   Periods usually regular x7d, changes tampon q 4hr at heaviest, +cramps and small clots. This period has had larger clots, about quarter-sized.    Was on COCs x years but started having intermenstrual spotting, so stopped them about 2 months ago. Not currently sexually active.  Patient's last menstrual period was 12/02/2023 (exact date). The current method of family planning is abstinence.  Last pap 2024. Results were: negative per pt report at [facility], no h/o abnormal     12/03/2023    1:29 PM 12/25/2017   11:35 AM  Depression screen PHQ 2/9  Decreased Interest 0 0  Down, Depressed, Hopeless 0 0  PHQ - 2 Score 0 0  Altered sleeping 1   Tired, decreased energy 1   Change in appetite 0   Feeling bad or failure about yourself  0   Trouble concentrating 0   Moving slowly or fidgety/restless 0   Suicidal thoughts 0   PHQ-9 Score 2         12/03/2023    1:29 PM  GAD 7 : Generalized Anxiety Score  Nervous, Anxious, on Edge 0  Control/stop worrying 0  Worry too much - different things 0  Trouble relaxing 0  Restless 0  Easily annoyed or irritable 1  Afraid - awful might happen 0  Total GAD 7 Score 1     Review of Systems:   Pertinent items are noted in HPI Denies fever/chills, dizziness, headaches, visual disturbances, fatigue, shortness of breath, chest pain,  abdominal pain, vomiting, abnormal vaginal discharge/itching/odor/irritation, problems with periods, bowel movements, urination, or intercourse unless otherwise stated above.  Pertinent History Reviewed:  Reviewed past medical,surgical, social, obstetrical and family history.  Reviewed problem list, medications and allergies. Physical Assessment:   Vitals:   12/03/23 1330  BP: 117/78  Pulse: 73  Weight: 169 lb (76.7 kg)  Height: 5' 3 (1.6 m)  Body mass index is 29.94 kg/m.       Physical Examination:   General appearance: alert, well appearing, and in no distress  Mental status: alert, oriented to person, place, and time  Skin: warm & dry   Cardiovascular: normal heart rate noted  Respiratory: normal respiratory effort, no distress  Abdomen: soft, non-tender   Pelvic: VULVA: normal appearing vulva with no masses, tenderness or lesions, VAGINA: normal appearing vagina with normal color and discharge, no lesions, normal menstrual blood CERVIX: normal appearing cervix without discharge or lesions, UTERUS: uterus is normal size, shape, consistency and nontender, ADNEXA: normal adnexa in size, no masses, +LLQ slight tenderness  Extremities: no edema   Chaperone: Peggy Dones  No results found for this or any previous visit (from the past 24 hours).  Assessment & Plan:  1) Lt flank/LLQ pain that began w/ period yesterday> neg renal stone CT yesterday, CV swab today, if neg will consider pelvic u/s. Consider restarting COCs if CV+ and we treat whatever is there,  she liked pills other than the intermenstrual spotting   Meds: No orders of the defined types were placed in this encounter.   No orders of the defined types were placed in this encounter.   Return for prn, please get last pap records from [facility].  Ferd Householder CNM, [facility] 12/03/2023 2:01 PM

## 2023-12-05 ENCOUNTER — Ambulatory Visit: Payer: Self-pay | Admitting: Women's Health

## 2023-12-05 LAB — CERVICOVAGINAL ANCILLARY ONLY
Bacterial Vaginitis (gardnerella): NEGATIVE
Candida Glabrata: NEGATIVE
Candida Vaginitis: NEGATIVE
Chlamydia: NEGATIVE
Comment: NEGATIVE
Comment: NEGATIVE
Comment: NEGATIVE
Comment: NEGATIVE
Comment: NEGATIVE
Comment: NORMAL
Neisseria Gonorrhea: NEGATIVE
Trichomonas: NEGATIVE

## 2023-12-12 NOTE — Telephone Encounter (Signed)
 Called patient. Informed swab was negative. States she would like to have the pelvic ultrasound as she is still experiencing pain but not as bad.  Off work next Monday and Tuesday if it is able to be done then. Informed u/s would be done outside of our office and will receive a call on getting that scheduled.

## 2023-12-13 ENCOUNTER — Other Ambulatory Visit: Payer: Self-pay | Admitting: Women's Health

## 2023-12-13 DIAGNOSIS — R102 Pelvic and perineal pain: Secondary | ICD-10-CM

## 2023-12-19 ENCOUNTER — Ambulatory Visit (HOSPITAL_COMMUNITY): Attending: Women's Health

## 2024-02-19 ENCOUNTER — Ambulatory Visit

## 2024-02-19 ENCOUNTER — Other Ambulatory Visit (HOSPITAL_COMMUNITY)
Admission: RE | Admit: 2024-02-19 | Discharge: 2024-02-19 | Disposition: A | Discharge status: Home / Self Care | Source: Ambulatory Visit | Attending: Obstetrics & Gynecology | Admitting: Obstetrics & Gynecology

## 2024-02-19 DIAGNOSIS — N898 Other specified noninflammatory disorders of vagina: Secondary | ICD-10-CM | POA: Diagnosis not present

## 2024-02-19 LAB — POCT URINE PREGNANCY: Preg Test, Ur: NEGATIVE

## 2024-02-19 NOTE — Progress Notes (Signed)
   NURSE VISIT- VAGINITIS/STD  SUBJECTIVE:  [place] M Dingus is a 23 y.o. G0P0000 GYN patientfemale here for a vaginal swab for vaginitis screening, STD screen.  She reports the following symptoms: local irritation for 2 weeks. Denies abnormal vaginal bleeding, significant pelvic pain, fever, or UTI symptoms. Patient also wanted to do a urine pregnancy test just in case. OBJECTIVE:  There were no vitals taken for this visit.  Appears well, in no apparent distress  ASSESSMENT: Vaginal swab for vaginitis screening  PLAN: Self-collected vaginal probe for Gonorrhea, Chlamydia, Trichomonas, Bacterial Vaginosis, Yeast sent to lab Pregnancy Test:Negative Treatment: to be determined once results are received Follow-up as needed if symptoms persist/worsen, or new symptoms develop  Aleck FORBES Blase  02/19/2024 1:38 PM

## 2024-02-20 ENCOUNTER — Encounter: Payer: Self-pay | Admitting: Women's Health

## 2024-02-20 LAB — CERVICOVAGINAL ANCILLARY ONLY
Bacterial Vaginitis (gardnerella): NEGATIVE
Candida Glabrata: NEGATIVE
Candida Vaginitis: POSITIVE — AB
Chlamydia: NEGATIVE
Comment: NEGATIVE
Comment: NEGATIVE
Comment: NEGATIVE
Comment: NEGATIVE
Comment: NEGATIVE
Comment: NORMAL
Neisseria Gonorrhea: NEGATIVE
Trichomonas: NEGATIVE

## 2024-02-21 ENCOUNTER — Ambulatory Visit: Payer: Self-pay | Admitting: Adult Health

## 2024-02-21 MED ORDER — FLUCONAZOLE 150 MG PO TABS
ORAL_TABLET | ORAL | 1 refills | Status: DC
Start: 2024-02-21 — End: 2024-03-23

## 2024-03-23 ENCOUNTER — Other Ambulatory Visit: Payer: Self-pay | Admitting: Adult Health

## 2024-07-05 ENCOUNTER — Telehealth: Admitting: Physician Assistant

## 2024-07-05 DIAGNOSIS — R6889 Other general symptoms and signs: Secondary | ICD-10-CM | POA: Diagnosis not present

## 2024-07-05 DIAGNOSIS — R051 Acute cough: Secondary | ICD-10-CM

## 2024-07-05 DIAGNOSIS — J069 Acute upper respiratory infection, unspecified: Secondary | ICD-10-CM | POA: Diagnosis not present

## 2024-07-05 MED ORDER — NAPROXEN 500 MG PO TABS: 500.0000 mg | ORAL_TABLET | Freq: Two times a day (BID) | ORAL | 0 refills | Status: AC

## 2024-07-05 MED ORDER — FLUTICASONE PROPIONATE 50 MCG/ACT NA SUSP: 2.0000 | Freq: Every day | NASAL | 6 refills | Status: AC

## 2024-07-05 MED ORDER — PSEUDOEPH-BROMPHEN-DM 30-2-10 MG/5ML PO SYRP: 5.0000 mL | ORAL_SOLUTION | Freq: Four times a day (QID) | ORAL | 0 refills | Status: AC | PRN

## 2024-07-05 NOTE — Progress Notes (Signed)
 E visit for Flu like symptoms   We are sorry that you are not feeling well.  Here is how we plan to help! Based on what you have shared with me it looks like you may have flu-like symptoms that should be watched but do not seem to indicate anti-viral treatment.  Influenza or the flu is  an infection caused by a respiratory virus. The flu virus is highly contagious and persons who did not receive their yearly flu vaccination may catch the flu from close contact.  We have anti-viral medications to treat the viruses that cause this infection. They are not a cure and only shorten the course of the infection. These prescriptions are most effective when they are given within the first 2 days of flu symptoms. Antiviral medications are indicated if you have a high risk of complications from the flu. You should  also consider an antiviral medication if you are in close contact with someone who is at risk. These medications can help patients avoid complications from the flu but have side effects that you should know.   Possible side effects from Tamiflu or oseltamivir include nausea, vomiting, diarrhea, dizziness, headaches, eye redness, sleep problems or other respiratory symptoms. You should not take Tamiflu if you have an allergy to oseltamivir or any to the ingredients in Tamiflu.   For nasal congestion, you may use an oral decongestant such as Mucinex  D or if you have glaucoma or high blood pressure use plain Mucinex .  Saline nasal spray or nasal drops can help and can safely be used as often as needed for congestion.  If you have a sore or scratchy throat, use a saltwater gargle-  to  teaspoon of salt dissolved in a 4-ounce to 8-ounce glass of warm water.  Gargle the solution for approximately 15-30 seconds and then spit.  It is important not to swallow the solution.  You can also use throat lozenges/cough drops and Chloraseptic spray to help with throat pain or discomfort.  Warm or cold liquids  can also be helpful in relieving throat pain.  For headache, pain or general discomfort, you can use Ibuprofen  or Tylenol  as directed.   Some authorities believe that zinc sprays or the use of Echinacea may shorten the course of your symptoms.  I have prescribed the following medications to help lessen symptoms: I have prescribed an anti-inflammatory - Naprosyn  500 mg. Take twice daily as needed for fever or body aches for 2 weeks and I have prescribed Fluticasone  nasal spray 2 sprays in each nostril one time per dayasal spray 2 sprays in each nostril one time per day  You are to isolate at home until you have been fever-free for at least 24 hours without a fever-reducing medication, and symptoms have been steadily improving for 24 hours.  If you must be around other household members who do not have symptoms, you need to make sure that both you and the family members are masking consistently with a high-quality mask.  If you note any worsening of symptoms despite treatment, please seek an in-person evaluation ASAP. If you note any significant shortness of breath or any chest pain, please seek ED evaluation. Please do not delay care!  ANYONE WHO HAS FLU SYMPTOMS SHOULD: Stay home. The flu is highly contagious and going out or to work exposes others! Be sure to drink plenty of fluids. Water is fine as well as fruit juices, sodas and electrolyte beverages. You may want to stay away from caffeine or  alcohol. If you are nauseated, try taking small sips of liquids. How do you know if you are getting enough fluid? Your urine should be a pale yellow or almost colorless. Get rest. Taking a steamy shower or using a humidifier may help nasal congestion and ease sore throat pain. Using a saline nasal spray works much the same way. Cough drops, hard candies and sore throat lozenges may ease your cough. Line up a caregiver. Have someone check on you regularly.  GET HELP RIGHT AWAY IF: You cannot keep down  liquids or your medications. You become short of breath Your fell like you are going to pass out or loose consciousness. Your symptoms persist after you have completed your treatment plan  MAKE SURE YOU  Understand these instructions. Will watch your condition. Will get help right away if you are not doing well or get worse.  Your e-visit answers were reviewed by a board certified advanced clinical practitioner to complete your personal care plan.  Depending on the condition, your plan could have included both over the counter or prescription medications.  If there is a problem please reply  once you have received a response from your provider.  Your safety is important to us .  If you have drug allergies check your prescription carefully.    You can use MyChart to ask questions about todays visit, request a non-urgent call back, or ask for a work or school excuse for 24 hours related to this e-Visit. If it has been greater than 24 hours you will need to follow up with your provider, or enter a new e-Visit to address those concerns.  You will get an e-mail in the next two days asking about your experience.  I hope that your e-visit has been valuable and will speed your recovery. Thank you for using e-visits.   I have spent 5 minutes in review of e-visit questionnaire, review and updating patient chart, medical decision making and response to patient.   Teena Shuck, PA-C
